# Patient Record
Sex: Male | Born: 1985 | Hispanic: No | Marital: Single | Smoking: Never smoker
Health system: Southern US, Community
[De-identification: ages and names within clinical notes are randomized; demographics above are authoritative.]

## PROBLEM LIST (undated history)

## (undated) HISTORY — PX: HERNIA REPAIR: SHX51

---

## 2015-11-01 ENCOUNTER — Emergency Department (HOSPITAL_COMMUNITY)
Admission: EM | Admit: 2015-11-01 | Discharge: 2015-11-02 | Disposition: A | Discharge status: Home / Self Care | Payer: No Typology Code available for payment source | Attending: Emergency Medicine | Admitting: Emergency Medicine

## 2015-11-01 ENCOUNTER — Encounter (HOSPITAL_COMMUNITY): Payer: Self-pay

## 2015-11-01 ENCOUNTER — Emergency Department (HOSPITAL_COMMUNITY): Payer: No Typology Code available for payment source

## 2015-11-01 DIAGNOSIS — Y999 Unspecified external cause status: Secondary | ICD-10-CM | POA: Insufficient documentation

## 2015-11-01 DIAGNOSIS — S161XXA Strain of muscle, fascia and tendon at neck level, initial encounter: Secondary | ICD-10-CM

## 2015-11-01 DIAGNOSIS — Z79899 Other long term (current) drug therapy: Secondary | ICD-10-CM | POA: Diagnosis not present

## 2015-11-01 DIAGNOSIS — Y939 Activity, unspecified: Secondary | ICD-10-CM | POA: Diagnosis not present

## 2015-11-01 DIAGNOSIS — Y92410 Unspecified street and highway as the place of occurrence of the external cause: Secondary | ICD-10-CM | POA: Diagnosis not present

## 2015-11-01 DIAGNOSIS — S199XXA Unspecified injury of neck, initial encounter: Secondary | ICD-10-CM | POA: Diagnosis present

## 2015-11-01 MED ORDER — NAPROXEN 500 MG PO TABS: 500.0000 mg | ORAL_TABLET | Freq: Two times a day (BID) | ORAL | Status: AC

## 2015-11-01 MED ORDER — CYCLOBENZAPRINE HCL 10 MG PO TABS
10.0000 mg | ORAL_TABLET | Freq: Once | ORAL | Status: AC
  Administered 2015-11-01: 10 mg via ORAL
  Filled 2015-11-01: qty 1

## 2015-11-01 MED ORDER — CYCLOBENZAPRINE HCL 10 MG PO TABS: 10.0000 mg | ORAL_TABLET | Freq: Two times a day (BID) | ORAL | Status: AC | PRN
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
## 2016-09-05 ENCOUNTER — Emergency Department (HOSPITAL_COMMUNITY)
Admission: EM | Admit: 2016-09-05 | Discharge: 2016-09-05 | Disposition: A | Discharge status: Home / Self Care | Payer: Managed Care, Other (non HMO) | Attending: Emergency Medicine | Admitting: Emergency Medicine

## 2016-09-05 ENCOUNTER — Encounter (HOSPITAL_COMMUNITY): Payer: Self-pay

## 2016-09-05 ENCOUNTER — Emergency Department (HOSPITAL_COMMUNITY): Payer: Managed Care, Other (non HMO)

## 2016-09-05 DIAGNOSIS — Y92410 Unspecified street and highway as the place of occurrence of the external cause: Secondary | ICD-10-CM | POA: Insufficient documentation

## 2016-09-05 DIAGNOSIS — Z79899 Other long term (current) drug therapy: Secondary | ICD-10-CM | POA: Diagnosis not present

## 2016-09-05 DIAGNOSIS — Y999 Unspecified external cause status: Secondary | ICD-10-CM | POA: Diagnosis not present

## 2016-09-05 DIAGNOSIS — S52134A Nondisplaced fracture of neck of right radius, initial encounter for closed fracture: Secondary | ICD-10-CM | POA: Diagnosis not present

## 2016-09-05 DIAGNOSIS — Y939 Activity, unspecified: Secondary | ICD-10-CM | POA: Insufficient documentation

## 2016-09-05 DIAGNOSIS — S4991XA Unspecified injury of right shoulder and upper arm, initial encounter: Secondary | ICD-10-CM
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
## 2016-09-05 NOTE — ED Notes (Signed)
Ortho at the bedside.

## 2016-09-05 NOTE — Discharge Instructions (Signed)
Follow-up with orthopedics as soon as possible for further evaluation. Continue ibuprofen or Tylenol for pain and inflammation. Return to ED for worsening pain, numbness, weakness, additional injury, trouble walking.
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
